# Patient Record
Sex: Female | Born: 1955 | Race: Black or African American | Hispanic: No | Smoking: Current every day smoker
Health system: Southern US, Community
[De-identification: ages and names within clinical notes are randomized; demographics above are authoritative.]

---

## 2008-06-12 ENCOUNTER — Emergency Department (HOSPITAL_BASED_OUTPATIENT_CLINIC_OR_DEPARTMENT_OTHER): Admission: EM | Admit: 2008-06-12 | Discharge: 2008-06-13 | Payer: Self-pay | Admitting: Emergency Medicine

## 2008-06-13 ENCOUNTER — Ambulatory Visit: Payer: Self-pay | Admitting: Radiology

## 2008-06-17 ENCOUNTER — Emergency Department (HOSPITAL_BASED_OUTPATIENT_CLINIC_OR_DEPARTMENT_OTHER): Admission: EM | Admit: 2008-06-17 | Discharge: 2008-06-17 | Payer: Self-pay | Admitting: Emergency Medicine

## 2008-08-09 ENCOUNTER — Inpatient Hospital Stay (HOSPITAL_COMMUNITY): Admission: RE | Admit: 2008-08-09 | Discharge: 2008-08-11 | Payer: Self-pay | Admitting: Internal Medicine

## 2008-08-09 ENCOUNTER — Encounter: Payer: Self-pay | Admitting: Emergency Medicine

## 2008-08-09 ENCOUNTER — Ambulatory Visit: Payer: Self-pay | Admitting: Diagnostic Radiology

## 2009-04-09 ENCOUNTER — Observation Stay (HOSPITAL_COMMUNITY): Admission: EM | Admit: 2009-04-09 | Discharge: 2009-04-09 | Payer: Self-pay | Admitting: Emergency Medicine

## 2009-07-09 IMAGING — CT CT ANGIO CHEST
2 of 6 series · 19 of 36 positions shown · IV contrast (APPLIED)
Comparison: Chest x-ray 08/09/2008

CLINICAL DATA: Left-sided chest pain which radiates to the
substernal region.  Dysphagia and shortness of breath.

CT ANGIOGRAPHY CHEST WITH CONTRAST
TECHNIQUE: Multidetector CT imaging of the chest was performed
using the standard protocol during bolus administration of
intravenous contrast. Multiplanar CT image reconstructions
including MIPs were obtained to evaluate the vascular anatomy.
Contrast: 80 ml Omnipaque 350

[Series 5: pe 1.0 b25f · axial · 0.61mm/px · z∈[+1098,+1367]mm · 18 of 299 slices shown]
[im 15/299  lung]
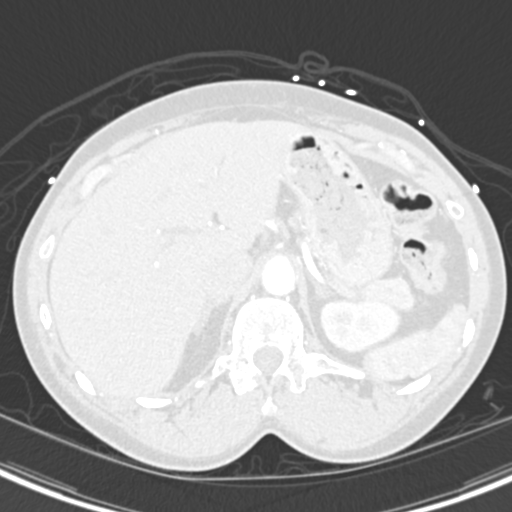
[im 30/299  mediastinal]
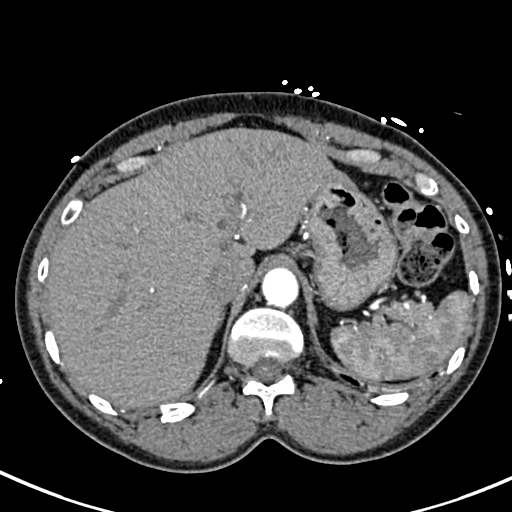
[im 45/299  lung]
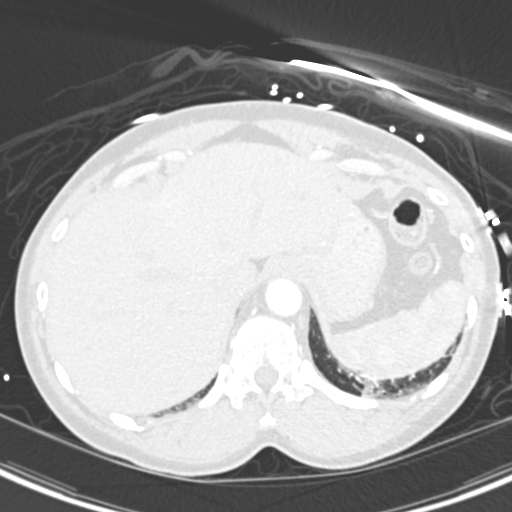
[im 60/299  mediastinal]
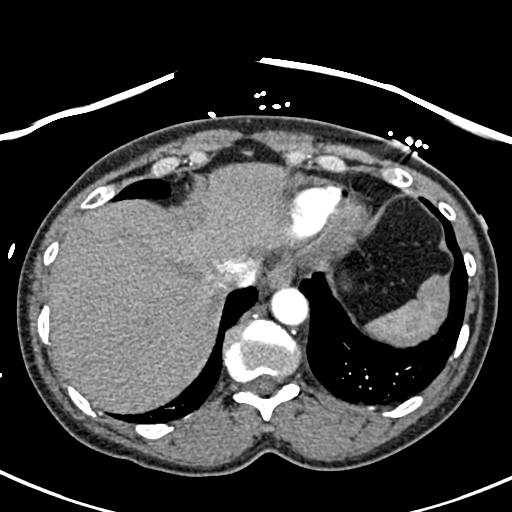
[im 75/299  lung]
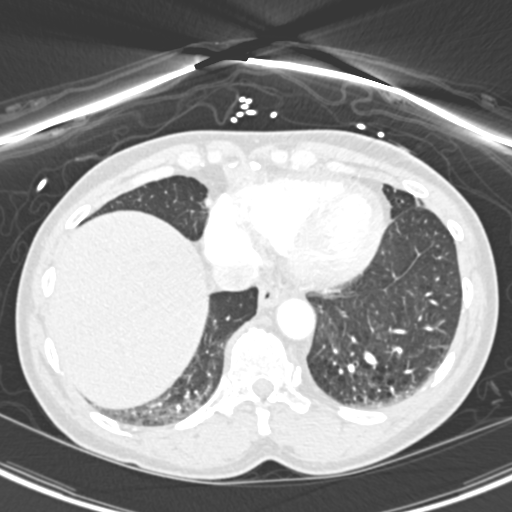
[im 90/299  mediastinal]
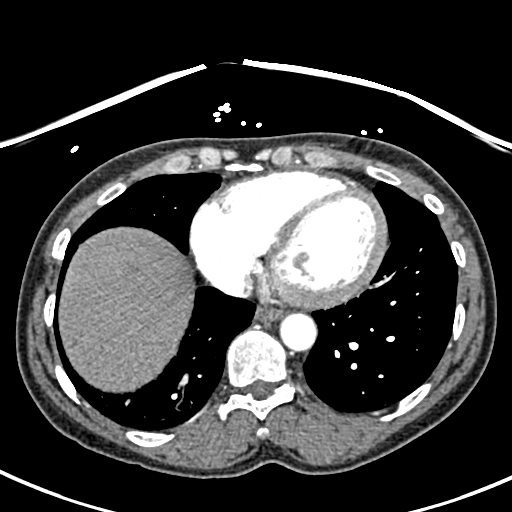
[im 105/299  lung]
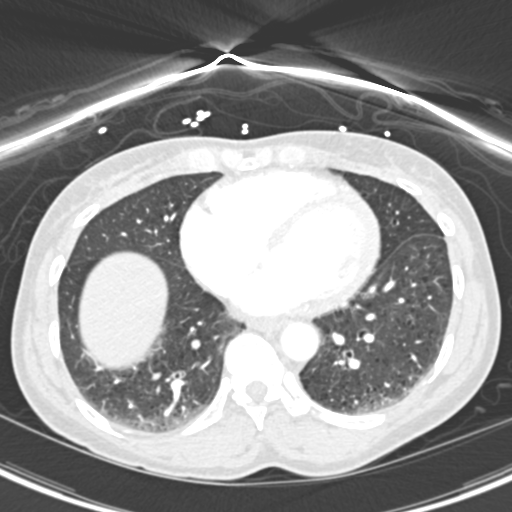
[im 120/299  mediastinal]
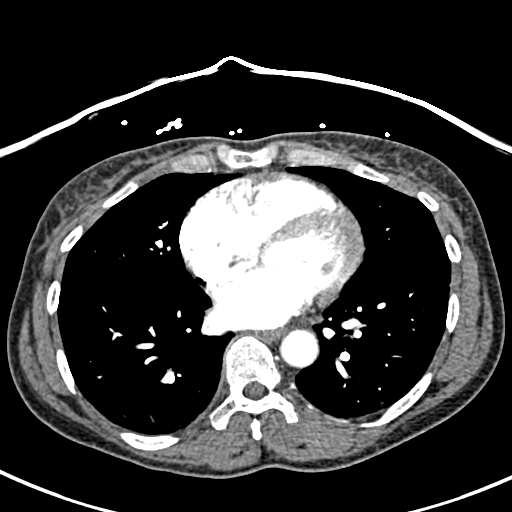
[im 135/299  lung]
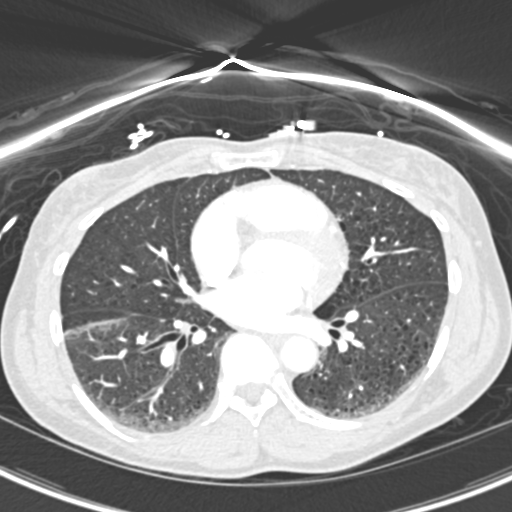
[im 164/299  mediastinal]
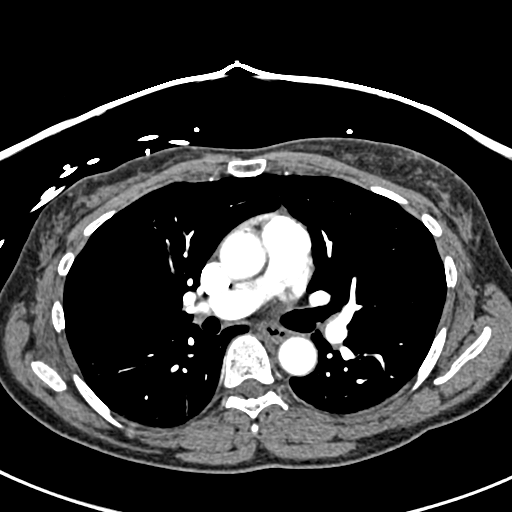
[im 179/299  lung]
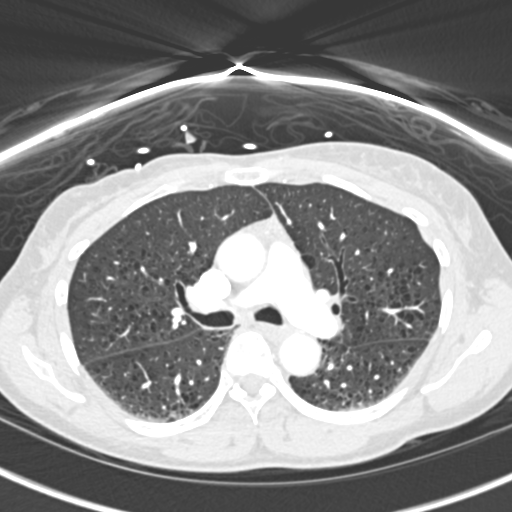
[im 194/299  mediastinal]
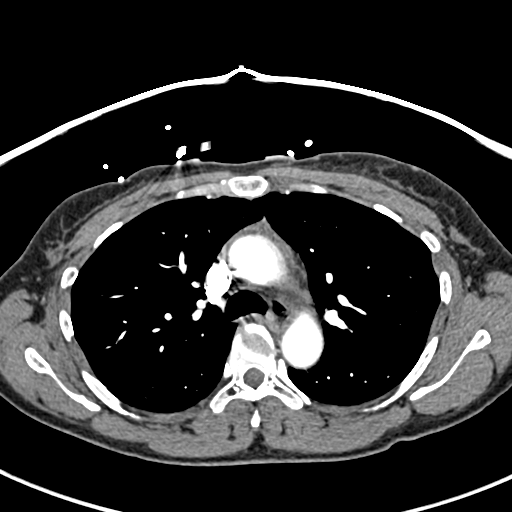
[im 209/299  lung]
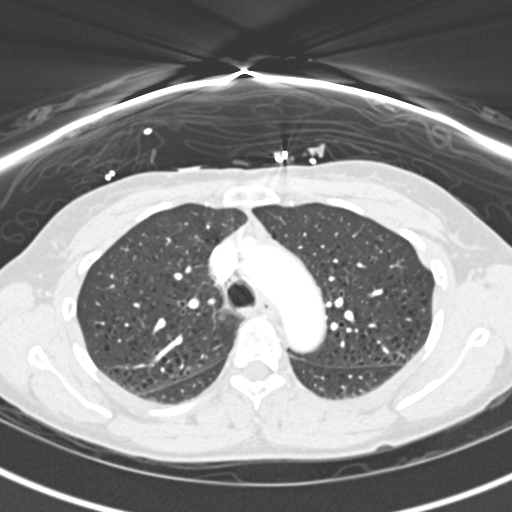
[im 224/299  mediastinal]
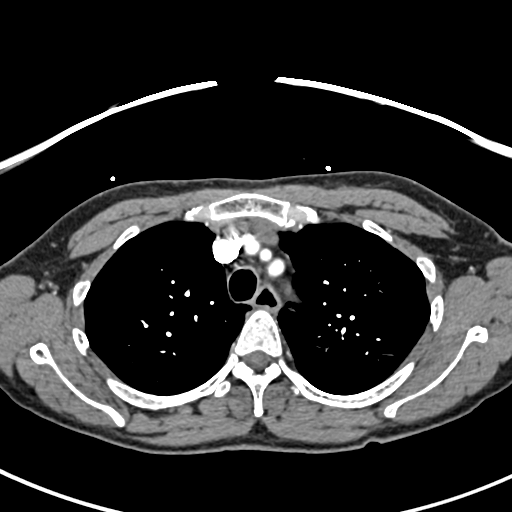
[im 239/299  lung]
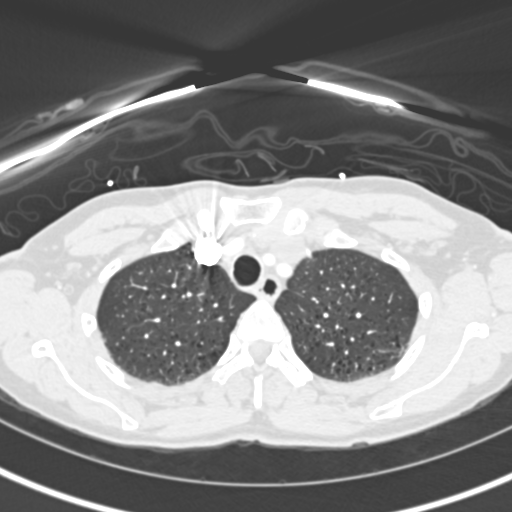
[im 254/299  mediastinal]
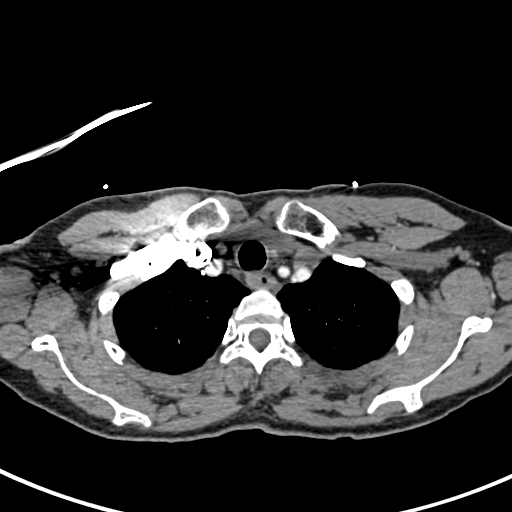
[im 269/299  lung]
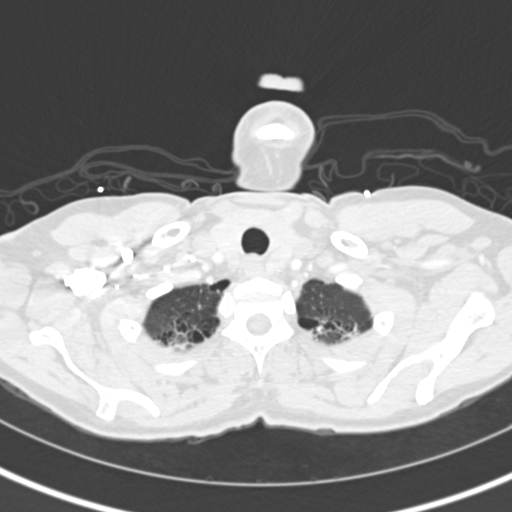
[im 284/299  mediastinal]
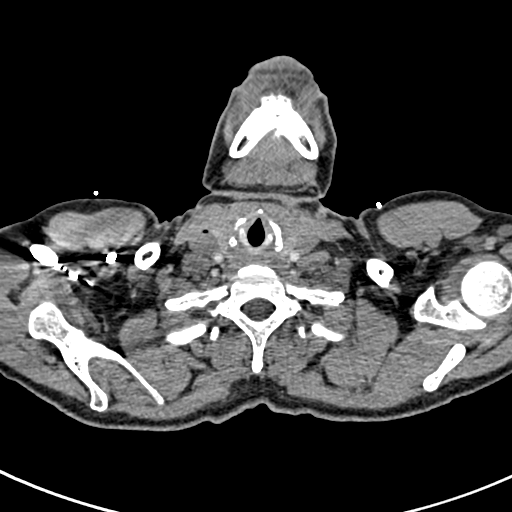

[Series 8: pe 2.0 coronal · coronal · 0.59mm/px · 1 of 107 slices shown]
[im 54/107  mediastinal]
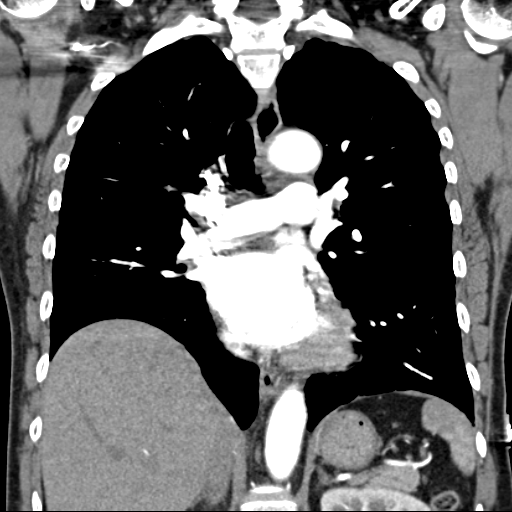

[19 of 36 positions shown; findings below may reference images not displayed]

FINDINGS: No pulmonary embolus.  There may be residual thymus in
the anterior mediastinum.  No pathologically enlarged mediastinal,
hilar or axillary lymph nodes.  Heart size is at the upper limits
of normal.  No pericardial effusion.

Biapical scarring.  Centrilobular emphysema.  Dependent atelectasis
and/or scarring bilaterally.  Lungs are otherwise clear.  No
pleural fluid.  Airway unremarkable.

Incidental imaging of the upper abdomen shows low attenuation
lesions in the liver, measuring up to 1.6 x 1.3 cm.  In the absence
of known malignancy, these are likely cysts and/or hemangiomas.
Old right rib fracture.  No worrisome lytic or sclerotic lesions.

Review of the MIP images confirms the above findings.
IMPRESSION: No pulmonary embolus.  No acute findings in the chest.

## 2010-05-24 LAB — POCT I-STAT, CHEM 8
Creatinine, Ser: 0.9 mg/dL (ref 0.4–1.2)
Hemoglobin: 14.3 g/dL (ref 12.0–15.0)
Potassium: 3.2 mEq/L — ABNORMAL LOW (ref 3.5–5.1)
TCO2: 26 mmol/L (ref 0–100)

## 2010-05-24 LAB — GLUCOSE, CAPILLARY: Glucose-Capillary: 116 mg/dL — ABNORMAL HIGH (ref 70–99)

## 2010-05-24 LAB — URINALYSIS, ROUTINE W REFLEX MICROSCOPIC
Glucose, UA: NEGATIVE mg/dL
Ketones, ur: NEGATIVE mg/dL
Nitrite: NEGATIVE
Protein, ur: NEGATIVE mg/dL
pH: 7.5 (ref 5.0–8.0)

## 2010-06-12 LAB — CBC
Hemoglobin: 13.1 g/dL (ref 12.0–15.0)
MCHC: 34.4 g/dL (ref 30.0–36.0)
MCV: 94.8 fL (ref 78.0–100.0)
Platelets: 208 10*3/uL (ref 150–400)
RBC: 4.02 MIL/uL (ref 3.87–5.11)
RDW: 12.6 % (ref 11.5–15.5)
WBC: 8.4 10*3/uL (ref 4.0–10.5)

## 2010-06-12 LAB — BASIC METABOLIC PANEL
BUN: 14 mg/dL (ref 6–23)
Creatinine, Ser: 0.7 mg/dL (ref 0.4–1.2)
GFR calc Af Amer: >60 mL/min (ref >60)
Potassium: 3.7 mEq/L (ref 3.5–5.1)
Sodium: 143 mEq/L (ref 135–145)

## 2010-06-12 LAB — CARDIAC PANEL(CRET KIN+CKTOT+MB+TROPI)
CK, MB: 1.8 ng/mL (ref 0.3–4.0)
CK, MB: 2 ng/mL (ref 0.3–4.0)
CK, MB: 2.5 ng/mL (ref 0.3–4.0)
Relative Index: 2.1 (ref 0.0–2.5)
Relative Index: INVALID (ref 0.0–2.5)
Relative Index: INVALID (ref 0.0–2.5)
Total CK: 121 U/L (ref 7–177)
Total CK: 81 U/L (ref 7–177)
Troponin I: 0.01 ng/mL (ref 0.00–0.06)

## 2010-06-12 LAB — RAPID URINE DRUG SCREEN, HOSP PERFORMED
Barbiturates: POSITIVE — AB
Benzodiazepines: NOT DETECTED
Cocaine: NOT DETECTED
Opiates: NOT DETECTED

## 2010-06-12 LAB — TSH: TSH: 1.638 u[IU]/mL (ref 0.350–4.500)

## 2010-06-12 LAB — LIPID PANEL: Triglycerides: 86 mg/dL (ref <150)

## 2010-06-14 LAB — URINALYSIS, ROUTINE W REFLEX MICROSCOPIC
Bilirubin Urine: NEGATIVE
Ketones, ur: NEGATIVE mg/dL
Nitrite: NEGATIVE
Specific Gravity, Urine: 1.005 (ref 1.005–1.030)
Urobilinogen, UA: 0.2 mg/dL (ref 0.0–1.0)

## 2010-06-14 LAB — GC/CHLAMYDIA PROBE AMP, GENITAL: GC Probe Amp, Genital: NEGATIVE

## 2010-06-14 LAB — PREGNANCY, URINE: Preg Test, Ur: NEGATIVE

## 2010-06-14 LAB — WET PREP, GENITAL

## 2010-06-14 LAB — URINE MICROSCOPIC-ADD ON

## 2010-07-18 NOTE — H&P (Signed)
Jessica Waller, Jessica Waller            ACCOUNT NO.:  [id number]   MEDICAL RECORD NO.:  [id number]          PATIENT TYPE:  INP   LOCATION:  1226                         FACILITY:  [facility]   PHYSICIAN:  Della Goo, M.D. DATE OF BIRTH:  11/26/1955   DATE OF ADMISSION:  08/09/2008  DATE OF DISCHARGE:                              HISTORY & PHYSICAL   PRIMARY CARE PHYSICIAN:  None.   CHIEF COMPLAINT:  Chest pain.   HISTORY OF PRESENT ILLNESS:  This is a 55 year old female who was sent  from the [facility] to the [facility] emergency  department for further evaluation secondary to complaints of chest pain  which she states started at about 2:00 p.m.  The patient describes  having constant squeezing sharp chest pain in the middle of her chest.  She reports having shortness of breath associated with the discomfort.  She denies having any nausea, vomiting or diaphoresis.   PAST MEDICAL HISTORY:  1. Arthritis.  2. Alcohol and drug dependence.  Of note, the patient has been in the      [facility] program for the past 70 days for detox from alcohol      and cocaine.  3,  History of tobacco abuse.   PAST SURGICAL HISTORY:  Patient reports having a gum surgery after a  failed root canal.   MEDICATIONS:  Celexa, prednisone taper, multivitamin, Prilosec,  Gabapentin, Mobic, Robaxin, ibuprofen and trazodone.   ALLERGIES:  NO KNOWN DRUG ALLERGIES.   SOCIAL HISTORY:  The patient reports smoking five to six cigarettes  daily, and she states he has smoked for many years.  She reports having  a past history of alcohol and cocaine abuse.  Her last drink and last  use of cocaine were 70 days ago.   FAMILY HISTORY:  Positive for coronary artery disease in her mother and  her brother, positive for hypertension in both parents, positive for  diabetes in her maternal grandmother.  No history of cancer in her  family that she knows of.   REVIEW OF SYSTEMS:  Pertinents are  mentioned above.   PHYSICAL EXAMINATION FINDINGS:  GENERAL:  This is a 55 year old thin  female in discomfort but no acute distress.  VITAL SIGNS:  Temperature 97.9, blood pressure 132/80, heart rate 79,  respirations 20, O2 sats 99%.  HEENT:  Normocephalic, atraumatic.  Pupils equally round, reactive to  light, extraocular movements are intact.  Funduscopic benign.  Oropharynx is clear.  NECK:  Supple full range of motion.  No thyromegaly, adenopathy, jugular  venous distention.  CARDIOVASCULAR:  Regular rate and rhythm.  No murmurs, gallops or rubs.  LUNGS:  Clear to auscultation bilaterally.  ABDOMEN:  Positive bowel sounds, soft, nontender, nondistended.  EXTREMITIES:  Without cyanosis, clubbing or edema.  NEUROLOGIC:  The patient is alert and oriented x3.  There are no focal  deficits on examination.   LABORATORY STUDIES:  White blood cell count 8.4, hemoglobin 13.1,  hematocrit 38.1, MCV 94.8, platelets 208, D-dimer is less than 0.22.  Sodium 143, potassium 3.7, chloride 106, carbon dioxide 26, BUN 14,  creatinine 0.7  and glucose 124.  EKG reveals a normal sinus rhythm  without acute ST-segment changes.  Point of care cardiac markers with a  myoglobin of 35.2, CK-MB 1.9, troponin less than 0.05.   ASSESSMENT:  A 55 year old female being admitted with:  1. Chest pain.  2. Tobacco abuse.  3. Past history of cocaine abuse.  4. Arthritis.   PLAN:  The patient will be admitted to the step-down ICU area.  She has  been placed on nitrates, oxygen and aspirin therapy.  The patient will  also be placed on DVT and GI prophylaxis.  Her regular medications have  been reviewed and continued.  Further workup will ensue pending results  of her studies and her clinical course.      Della Goo, M.D.  Electronically Signed     HJ/MEDQ  D:  08/10/2008  T:  08/10/2008  Job:  [id number]

## 2010-07-18 NOTE — Discharge Summary (Signed)
Jessica Waller, Jessica Waller            ACCOUNT NO.:  [id number]   MEDICAL RECORD NO.:  [id number]          PATIENT TYPE:  INP   LOCATION:  1504                         FACILITY:  [facility]   PHYSICIAN:  Beckey Rutter, MD  DATE OF BIRTH:  1955/05/06   DATE OF ADMISSION:  08/09/2008  DATE OF DISCHARGE:  08/11/2008                               DISCHARGE SUMMARY   PRIMARY CARE PHYSICIAN:  None.   CHIEF COMPLAINT:  Chest pain.   HISTORY OF PRESENT ILLNESS AND HOSPITAL COURSE:  A 55 year old very  pleasant African American female admitted from [facility] detox because  of chest pain.  The patient was ruled out for acute coronary syndrome by  serial cardiac enzymes and EKGs.  The patient had CT angiogram which was  also negative.  Her chest pain was atypical and unlikely noncardiac in  origin.  That being said, I advised the patient to follow up with the  primary physician for further stress test as needed.  She is aware and  agreeable to the followup plan.   DISCHARGE DIAGNOSES:  1. Chest pain ruled out for acute coronary syndrome.  2. Tobacco abuse.  3. Past history of cocaine abuse and ethanol abuse.  4. Arthritis.   DISCHARGE MEDICATIONS:  1. Aspirin 81 mg daily.  2. Celexa 60 mg p.o. daily.  3. Neurontin 300 mg p.o. daily.  4. Mobic 7.5 mg p.o. b.i.d.  5. Multivitamins 1 tablet daily.  6. Prednisone 20 mg p.o. daily.  7. Trazodone 100 mg p.o. q.h.s.  8. Senokot-S 1 tablet p.o. q.h.s. p.r.n.  9. Ibuprofen/Advil 600 mg p.o. q.8 h p.r.n.   DISCHARGE PLAN:  As discussed above the patient was advised to follow up  with the primary care physician for maintenance purposes as well as  further stratification.  Currently the patient does not seem to have a  cardiogenic origin for her chest pain.  She was also ruled out for acute  coronary syndrome as discussed above.  She was stable to be released  back to [place] to continue her detox.      Beckey Rutter, MD  Electronically  Signed    EME/MEDQ  D:  08/11/2008  T:  08/11/2008  Job:  [id number]

## 2020-05-06 ENCOUNTER — Encounter: Payer: Self-pay | Admitting: Family Medicine

## 2020-05-06 ENCOUNTER — Ambulatory Visit (INDEPENDENT_AMBULATORY_CARE_PROVIDER_SITE_OTHER): Payer: 59 | Admitting: Family Medicine

## 2020-05-06 ENCOUNTER — Other Ambulatory Visit: Payer: Self-pay

## 2020-05-06 VITALS — BP 110/64 | HR 82 | Ht 66.5 in | Wt 124.0 lb

## 2020-05-06 DIAGNOSIS — I493 Ventricular premature depolarization: Secondary | ICD-10-CM

## 2020-05-06 DIAGNOSIS — Z0000 Encounter for general adult medical examination without abnormal findings: Secondary | ICD-10-CM

## 2020-05-06 DIAGNOSIS — M1991 Primary osteoarthritis, unspecified site: Secondary | ICD-10-CM | POA: Diagnosis not present

## 2020-05-06 DIAGNOSIS — J439 Emphysema, unspecified: Secondary | ICD-10-CM | POA: Diagnosis not present

## 2020-05-06 DIAGNOSIS — E559 Vitamin D deficiency, unspecified: Secondary | ICD-10-CM

## 2020-05-06 DIAGNOSIS — G479 Sleep disorder, unspecified: Secondary | ICD-10-CM

## 2020-05-06 DIAGNOSIS — F17200 Nicotine dependence, unspecified, uncomplicated: Secondary | ICD-10-CM

## 2020-05-06 NOTE — Progress Notes (Signed)
    SUBJECTIVE:   CHIEF COMPLAINT / HPI:   New patient:  Patient here today to establish care.  She previously went to [facility] in [place].  Past medical issues include emphysema, degenerative disc disease, prediabetes, and vitamin d deficiency.  She states she is up to date on her pap, mammogram, and colonoscopy.    Pt states she takes trazadone 75mg  qhs for sleep.  She also uses albuterol as needed. She uses her albuterol once a week approximately.  She takes extra strength tylenol approximately 4 times a week for arthritis.  She takes a multivitamin once a day.  The patient states she wore a holter monitor for two weeks a few years ago due to presyncopal episodes.  She states they found frequent PVCs but nothing else.    Vit D deficiency - had a level of 16. Was prescribed pills once a week. 8 weeks.  Never rechecked vit d levels after taht.     PERTINENT  PMH / PSH:  0.5 ppd smoker .  No desire to quit.  Hasn't quit since pregnancy.  Pt thinks she has a ct chest for lung cancer screening in the past.   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 .   OBJECTIVE:   BP 110/64   Pulse 82   Ht 5' 6.5" (1.689 m)   Wt 124 lb (56.2 kg)   SpO2 93%   BMI 19.71 kg/m   Gen: alert, oriented. No acute distress.  Heent: perrla, eomi.  False upper teeth.  Cv: RRR. No murmurs Pulm: lctab Gi: soft, nontender.  Msk: 5/5 strength b/l.  Neuro: CN 2-12 grossly intact.    ASSESSMENT/PLAN:   Healthcare maintenance Appears to have had mammogram as recently as 12/2018 which was normal.  Unable to see colonoscopy results.  Had a CT chest in 2019 that showed emphysema but no masses.  Normal pap in 01/2020.    Smoker Pt has no desire to quit at this time.    Vitamin D deficiency Will get repeat now that she has completed her course.   Osteoarthritis Chronic stable.  Takes 4 tylenol a week.    Emphysema of lung (HCC) Uses albuterol approx once a week.    Difficulty  sleeping Takes trazadone 1.5 tabs of 50mg  qhs.  Does not need refill today.       02/2020, MD [facility]

## 2020-05-06 NOTE — Patient Instructions (Addendum)
It was nice to see you today,  I would like to see you back in 3 months or sooner if you need to.  I have sent in a new prescription for your trazodone that will be ready at the end of the month when you need it.  I have ordered a vitamin D lab to check for your vitamin D deficiency.  I will call you with results when I get them.  Have a nice day,  Frederic Jericho, MD

## 2020-05-07 LAB — CBC WITH DIFFERENTIAL
Basophils Absolute: 0 10*3/uL (ref 0.0–0.2)
Basos: 0 %
EOS (ABSOLUTE): 0 10*3/uL (ref 0.0–0.4)
Eos: 0 %
Hematocrit: 38.7 % (ref 34.0–46.6)
Hemoglobin: 13.1 g/dL (ref 11.1–15.9)
Immature Grans (Abs): 0 10*3/uL (ref 0.0–0.1)
Immature Granulocytes: 0 %
Lymphocytes Absolute: 2.6 10*3/uL (ref 0.7–3.1)
Lymphs: 51 %
MCH: 31.4 pg (ref 26.6–33.0)
MCHC: 33.9 g/dL (ref 31.5–35.7)
MCV: 93 fL (ref 79–97)
Monocytes Absolute: 0.4 10*3/uL (ref 0.1–0.9)
Monocytes: 8 %
Neutrophils Absolute: 2.1 10*3/uL (ref 1.4–7.0)
Neutrophils: 41 %
RBC: 4.17 x10E6/uL (ref 3.77–5.28)
RDW: 13.2 % (ref 11.7–15.4)
WBC: 5 10*3/uL (ref 3.4–10.8)

## 2020-05-07 LAB — VITAMIN D 25 HYDROXY (VIT D DEFICIENCY, FRACTURES): Vit D, 25-Hydroxy: 29.8 ng/mL — ABNORMAL LOW (ref 30.0–100.0)

## 2020-05-08 DIAGNOSIS — J439 Emphysema, unspecified: Secondary | ICD-10-CM | POA: Insufficient documentation

## 2020-05-08 DIAGNOSIS — I493 Ventricular premature depolarization: Secondary | ICD-10-CM | POA: Insufficient documentation

## 2020-05-08 DIAGNOSIS — E559 Vitamin D deficiency, unspecified: Secondary | ICD-10-CM | POA: Insufficient documentation

## 2020-05-08 DIAGNOSIS — Z0000 Encounter for general adult medical examination without abnormal findings: Secondary | ICD-10-CM | POA: Insufficient documentation

## 2020-05-08 DIAGNOSIS — M1990 Unspecified osteoarthritis, unspecified site: Secondary | ICD-10-CM | POA: Insufficient documentation

## 2020-05-08 DIAGNOSIS — F17200 Nicotine dependence, unspecified, uncomplicated: Secondary | ICD-10-CM | POA: Insufficient documentation

## 2020-05-08 DIAGNOSIS — G479 Sleep disorder, unspecified: Secondary | ICD-10-CM | POA: Insufficient documentation

## 2020-05-08 NOTE — Assessment & Plan Note (Signed)
Pt has no desire to quit at this time.

## 2020-05-08 NOTE — Assessment & Plan Note (Signed)
Will get repeat now that she has completed her course.

## 2020-05-08 NOTE — Assessment & Plan Note (Signed)
Chronic stable.  Takes 4 tylenol a week.

## 2020-05-08 NOTE — Assessment & Plan Note (Signed)
Appears to have had mammogram as recently as 12/2018 which was normal.  Unable to see colonoscopy results.  Had a CT chest in 2019 that showed emphysema but no masses.  Normal pap in 01/2020.

## 2020-05-08 NOTE — Assessment & Plan Note (Signed)
Uses albuterol approx once a week.

## 2020-05-08 NOTE — Assessment & Plan Note (Signed)
Takes trazadone 1.5 tabs of 50mg  qhs.  Does not need refill today.

## 2020-05-10 ENCOUNTER — Encounter: Payer: Self-pay | Admitting: Family Medicine

## 2020-06-20 ENCOUNTER — Ambulatory Visit (INDEPENDENT_AMBULATORY_CARE_PROVIDER_SITE_OTHER): Payer: 59 | Admitting: Family Medicine

## 2020-06-20 ENCOUNTER — Other Ambulatory Visit: Payer: Self-pay

## 2020-06-20 ENCOUNTER — Encounter: Payer: Self-pay | Admitting: Family Medicine

## 2020-06-20 DIAGNOSIS — E559 Vitamin D deficiency, unspecified: Secondary | ICD-10-CM | POA: Diagnosis not present

## 2020-06-20 DIAGNOSIS — Z0000 Encounter for general adult medical examination without abnormal findings: Secondary | ICD-10-CM | POA: Diagnosis not present

## 2020-06-20 MED ORDER — TRAZODONE HCL 50 MG PO TABS: 75.0000 mg | ORAL_TABLET | Freq: Every evening | ORAL | 1 refills | Status: DC | PRN

## 2020-06-20 NOTE — Patient Instructions (Addendum)
It was nice to see you today,  Your vitamin D was barely low at 29.8 (30.0 is normal).  You need to take any vitamin D supplementation, but if you choose to I would take 1000 IU of vitamin D3 daily.  I will refill your trazodone prescription for 90 days.  Please follow-up with him the next 6 months.  Have a great day,  Frederic Jericho, MD

## 2020-06-20 NOTE — Progress Notes (Signed)
    SUBJECTIVE:   CHIEF COMPLAINT / HPI:   Patient was here to discuss her mom CBC from last decreased to 29.8.  Discussed pros and cons of supplementation with patient.  She is not interested in vitamin D supplementation at this time.  Takes a multivitamin which likely has calcium in it.  Patient had a colonoscopy 20.  Results are in the Care Everywhere.  Marland Kitchen  COVID booster: Patient interested in getting the Covid booster.  Has had 1 during previously.  Thinks it has been at least 3 months since her last booster.  Does not have proof of vaccination on her right now.  Patient will go to [facility] for booster  PERTINENT  PMH / PSH:   OBJECTIVE:   BP 108/68   Pulse 77   Ht 5\' 7"  (1.702 m)   Wt 130 lb 3.2 oz (59.1 kg)   SpO2 94%   BMI 20.39 kg/m   General: Alert and oriented.  No acute distress. No respiratory distress.  No tachypnea.  ASSESSMENT/PLAN:   Vitamin D deficiency Slightly low at 29.8 from last visit.  Engaged in shared decision-making with the patient and she opted not to start daily supplementation.  She takes a multivitamin daily which likely has some vitamin D and calcium in it.  Healthcare maintenance Discussed getting a fourth dose of COVID vaccine.  Patient has had Moderna previously.  She would like to stick with the same brand so she will go to the [facility].  Advised her that she is eligible for fourth dose given her age and smoking status.  Colonoscopy results from 2020 are in care everywhere.  Will document healthcare maintenance tab.  Patient not interested HIV or hep C screening today.     , MD [facility]

## 2020-06-20 NOTE — Assessment & Plan Note (Signed)
Discussed getting a fourth dose of COVID vaccine.  Patient has had Moderna previously.  She would like to stick with the same brand so she will go to the [facility].  Advised her that she is eligible for fourth dose given her age and smoking status.  Colonoscopy results from 2020 are in care everywhere.  Will document healthcare maintenance tab.  Patient not interested HIV or hep C screening today.

## 2020-06-20 NOTE — Assessment & Plan Note (Signed)
Slightly low at 29.8 from last visit.  Engaged in shared decision-making with the patient and she opted not to start daily supplementation.  She takes a multivitamin daily which likely has some vitamin D and calcium in it.

## 2020-09-27 ENCOUNTER — Other Ambulatory Visit: Payer: Self-pay | Admitting: Family Medicine

## 2020-12-16 ENCOUNTER — Ambulatory Visit: Payer: 59 | Admitting: Family Medicine

## 2021-01-03 ENCOUNTER — Other Ambulatory Visit: Payer: Self-pay

## 2021-01-03 ENCOUNTER — Ambulatory Visit (INDEPENDENT_AMBULATORY_CARE_PROVIDER_SITE_OTHER): Payer: 59 | Admitting: Family Medicine

## 2021-01-03 ENCOUNTER — Encounter: Payer: Self-pay | Admitting: Family Medicine

## 2021-01-03 VITALS — BP 124/65 | HR 66 | Temp 98.7°F | Ht 67.0 in | Wt 127.2 lb

## 2021-01-03 DIAGNOSIS — F17200 Nicotine dependence, unspecified, uncomplicated: Secondary | ICD-10-CM

## 2021-01-03 DIAGNOSIS — G479 Sleep disorder, unspecified: Secondary | ICD-10-CM | POA: Diagnosis not present

## 2021-01-03 DIAGNOSIS — Z23 Encounter for immunization: Secondary | ICD-10-CM | POA: Diagnosis not present

## 2021-01-03 MED ORDER — TRAZODONE HCL 50 MG PO TABS: ORAL_TABLET | ORAL | 1 refills | Status: DC

## 2021-01-03 NOTE — Assessment & Plan Note (Signed)
Trazodone refilled.

## 2021-01-03 NOTE — Addendum Note (Signed)
Addended by: Littie Deeds D on: 01/03/2021 08:09 PM   Modules accepted: Kipp Brood

## 2021-01-03 NOTE — Progress Notes (Addendum)
    SUBJECTIVE:   CHIEF COMPLAINT / HPI:   Patient is here for her flu vaccine and to meet new PCP.  No concerns today.  She needs refill of her trazodone.  Discussed tobacco use.  She has no interest in quitting currently.  She lives with her two sons, 58 and 29. She works as a Estate agent at [employer].  PERTINENT  PMH / PSH: tobacco use, emphysema, OA, insomnia  OBJECTIVE:   BP 124/65   Pulse 66   Temp 98.7 F (37.1 C)   Ht 5\' 7"  (1.702 m)   Wt 127 lb 3.2 oz (57.7 kg)   SpO2 95%   BMI 19.92 kg/m   General: Alert, NAD HEENT: MMM, posterior oropharynx clear CV: RRR, no murmurs Pulm: CTAB, mildly decreased breath sounds, no wheezes or rales  ASSESSMENT/PLAN:   Difficulty sleeping Trazodone refilled.  Smoker Counseling provided, patient is precontemplative.   HCM - Covid vaccine booster recommended, plans to return for this - Tdap, reports received it 2 years ago - pneumonia vaccine due, plans to return for this - shingles vaccine declined, states too expensive - flu shot given today - mammogram due, patient will schedule  , MD [facility]

## 2021-01-03 NOTE — Assessment & Plan Note (Signed)
Counseling provided, patient is precontemplative.

## 2021-01-03 NOTE — Patient Instructions (Addendum)
It was nice seeing you today!  Come back and schedule a nurse visit for your vaccines. You are due for the pneumonia shot and I also recommend the new Covid booster.  Next physical in 1 year or return sooner if needed.  Please arrive at least 15 minutes prior to your scheduled appointments.  Stay well, Jessica Deeds, MD [facility] [phone]

## 2021-09-15 ENCOUNTER — Encounter: Payer: Self-pay | Admitting: Family Medicine

## 2021-09-15 ENCOUNTER — Telehealth: Payer: Self-pay | Admitting: Family Medicine

## 2021-09-15 ENCOUNTER — Ambulatory Visit (INDEPENDENT_AMBULATORY_CARE_PROVIDER_SITE_OTHER): Payer: 59 | Admitting: Family Medicine

## 2021-09-15 VITALS — BP 144/86 | HR 70 | Ht 67.0 in | Wt 128.6 lb

## 2021-09-15 DIAGNOSIS — Z23 Encounter for immunization: Secondary | ICD-10-CM | POA: Diagnosis not present

## 2021-09-15 DIAGNOSIS — Z1159 Encounter for screening for other viral diseases: Secondary | ICD-10-CM | POA: Diagnosis not present

## 2021-09-15 DIAGNOSIS — F17200 Nicotine dependence, unspecified, uncomplicated: Secondary | ICD-10-CM

## 2021-09-15 DIAGNOSIS — R7303 Prediabetes: Secondary | ICD-10-CM | POA: Diagnosis not present

## 2021-09-15 DIAGNOSIS — Z1231 Encounter for screening mammogram for malignant neoplasm of breast: Secondary | ICD-10-CM

## 2021-09-15 DIAGNOSIS — Z0000 Encounter for general adult medical examination without abnormal findings: Secondary | ICD-10-CM | POA: Diagnosis not present

## 2021-09-15 DIAGNOSIS — Z114 Encounter for screening for human immunodeficiency virus [HIV]: Secondary | ICD-10-CM | POA: Diagnosis not present

## 2021-09-15 NOTE — Telephone Encounter (Signed)
Patient came in stating that she the [facility] does not take Friday [facility] and wants to be referred to somewhere that accepts her insurance if possible.

## 2021-09-15 NOTE — Patient Instructions (Addendum)
It was nice seeing you today!  Let us know if you ever want help quitting smoking.  Stay well, Littie Deeds, MD [facility] [phone]  --  Make sure to check out at the front desk before you leave today.  Please arrive at least 15 minutes prior to your scheduled appointments.  If you had blood work today, I will send you a MyChart message or a letter if results are normal. Otherwise, I will give you a call.  If you had a referral placed, they will call you to set up an appointment. Please give [place] a call if you don't hear back in the next 2 weeks.  If you need additional refills before your next appointment, please call your pharmacy first.

## 2021-09-15 NOTE — Progress Notes (Cosign Needed Addendum)
    SUBJECTIVE:   CHIEF COMPLAINT / HPI:   Jessica Waller is a 66 y.o. female who presents today for follow-up. She is doing well overall today and has no major complaints.  She does report some trouble falling asleep, will wake up frequently if not taking Trazodone. She does report sleeping better with Trazodone. Hasn't tried anything else other than Trazodone to help with sleep. Sometimes snores while sleeping. No sleep apnea workup.  She denies SOB, ever, chills, body aches.  She reports infrequent chest discomfort; sometimes while laying down, often after eating. Described as a clenching sensation. Happening once or twice a month, will happen at night (not waking her from sleep); not lasting a long time.  She currently smokes 0.5 ppd; she has smoked since her 70s. Not willing to quit at this time. No recreational drug use. No alcohol use.  Diet: two meals a day, primarily home cooked, trying for a good balance. 1 cup of coffee a day in the mornings; 1 soda at lunch. Exercise: Walking 1-2 miles a day Work: [employer]. Lives: With her nephew, occasionally a son. Feels safe at home.  PERTINENT  PMH / PSH: PVCs, prediabetes, tobacco use  OBJECTIVE:   BP (!) 144/86   Pulse 70   Ht 5\' 7"  (1.702 m)   Wt 128 lb 9.6 oz (58.3 kg)   SpO2 100%   BMI 20.14 kg/m   General: Pt is well appearing and seated comfortably; no acute distress. Cardiovascular: No murmurs, rubs, gallops. Occasional premature beats. Pulmonary: Normal work of breathing. Lungs clear to auscultation bilaterally. Neuro/Psych: A&Ox3. Normal affect.  ASSESSMENT/PLAN:   Prediabetes Previous HA1c form 11/2019 was in the prediabetic range at 6.1. - Will continue to monitor with repeat HA1c today.  Smoker She continues to smoke 0.5 ppd. No interest in quitting at this time. - Pt informed that, should she ever become interested in quitting, she can always reach out to this office for assistance.   Healthcare  Maintenance - Order placed for Mammogram - Order placed for CT Chest given pt is a longstanding, current smoker - Order placed for blood work to look for Hep C, HIV; lipid panel ordered to monitor cholesterol - Tdap booster given in clinic today  12/2019, Medical Student [facility]   Approximately 20 pack-year smoking history.  Elevated BP - mildly elevated, no prior elevated readings. Re-check in 6 months.  I was personally present and performed or re-performed the history, physical exam and medical decision making activities of this service and have verified that the service and findings are accurately documented in the student's note.  Governor Rooks, MD                  09/15/2021, 5:38 PM

## 2021-09-15 NOTE — Progress Notes (Deleted)
    SUBJECTIVE:   CHIEF COMPLAINT / HPI:  Chief Complaint  Patient presents with   Annual Exam    ***  PERTINENT  PMH / PSH: ***  Patient Care Team: Littie Deeds, MD as PCP - General (Family Medicine)   OBJECTIVE:   BP (!) 144/86   Pulse 70   Ht 5\' 7"  (1.702 m)   Wt 128 lb 9.6 oz (58.3 kg)   SpO2 100%   BMI 20.14 kg/m   Physical Exam      09/15/2021    3:57 PM  Depression screen PHQ 2/9  Decreased Interest 1  Down, Depressed, Hopeless 0  PHQ - 2 Score 1  Altered sleeping 3  Tired, decreased energy 2  Change in appetite 1  Feeling bad or failure about yourself  0  Trouble concentrating 0  Moving slowly or fidgety/restless 0  Suicidal thoughts 0  PHQ-9 Score 7     {Show previous vital signs (optional):23777}  {Labs  Heme  Chem  Endocrine  Serology  Results Review (optional):23779}  ASSESSMENT/PLAN:   No problem-specific Assessment & Plan notes found for this encounter.   HCM - mammogram - Tdap [facility] ***  No follow-ups on file.   4/9, MD [facility]

## 2021-09-15 NOTE — Assessment & Plan Note (Signed)
She continues to smoke 0.5 ppd. No interest in quitting at this time. - Pt informed that, should she ever become interested in quitting, she can always reach out to this office for assistance.

## 2021-09-15 NOTE — Assessment & Plan Note (Signed)
Previous HA1c form 11/2019 was in the prediabetic range at 6.1. - Will continue to monitor with repeat HA1c today.

## 2021-09-16 LAB — LIPID PANEL
Chol/HDL Ratio: 2.8 ratio (ref 0.0–4.4)
Cholesterol, Total: 193 mg/dL (ref 100–199)
HDL: 69 mg/dL (ref >39)
LDL Chol Calc (NIH): 112 mg/dL — ABNORMAL HIGH (ref 0–99)
Triglycerides: 64 mg/dL (ref 0–149)
VLDL Cholesterol Cal: 12 mg/dL (ref 5–40)

## 2021-09-16 LAB — HCV AB W REFLEX TO QUANT PCR: HCV Ab: NONREACTIVE

## 2021-09-16 LAB — HEMOGLOBIN A1C
Est. average glucose Bld gHb Est-mCnc: 126 mg/dL
Hgb A1c MFr Bld: 6 % — ABNORMAL HIGH (ref 4.8–5.6)

## 2021-09-16 LAB — HIV ANTIBODY (ROUTINE TESTING W REFLEX): HIV Screen 4th Generation wRfx: NONREACTIVE

## 2021-09-16 LAB — HCV INTERPRETATION

## 2021-09-19 NOTE — Telephone Encounter (Signed)
Please advise patient she can have her mammogram done at [facility]. [phone] should be the number for her to call and schedule. I have placed an updated order.

## 2021-09-21 ENCOUNTER — Other Ambulatory Visit: Payer: Self-pay | Admitting: Family Medicine

## 2021-09-21 DIAGNOSIS — E785 Hyperlipidemia, unspecified: Secondary | ICD-10-CM

## 2021-09-21 MED ORDER — ATORVASTATIN CALCIUM 20 MG PO TABS: 20.0000 mg | ORAL_TABLET | Freq: Every day | ORAL | 0 refills | Status: DC

## 2021-09-29 ENCOUNTER — Ambulatory Visit (HOSPITAL_COMMUNITY)
Admission: RE | Admit: 2021-09-29 | Discharge: 2021-09-29 | Disposition: A | Discharge status: Home / Self Care | Payer: 59 | Source: Ambulatory Visit | Attending: Family Medicine | Admitting: Family Medicine

## 2021-09-29 ENCOUNTER — Ambulatory Visit (HOSPITAL_BASED_OUTPATIENT_CLINIC_OR_DEPARTMENT_OTHER)
Admission: RE | Admit: 2021-09-29 | Discharge: 2021-09-29 | Disposition: A | Discharge status: Home / Self Care | Payer: 59 | Source: Ambulatory Visit | Attending: Family Medicine | Admitting: Family Medicine

## 2021-09-29 ENCOUNTER — Encounter (HOSPITAL_COMMUNITY): Payer: Self-pay

## 2021-09-29 DIAGNOSIS — Z1231 Encounter for screening mammogram for malignant neoplasm of breast: Secondary | ICD-10-CM

## 2021-09-29 DIAGNOSIS — F17200 Nicotine dependence, unspecified, uncomplicated: Secondary | ICD-10-CM | POA: Diagnosis present

## 2021-09-29 DIAGNOSIS — Z0000 Encounter for general adult medical examination without abnormal findings: Secondary | ICD-10-CM

## 2021-10-01 ENCOUNTER — Encounter: Payer: Self-pay | Admitting: Family Medicine

## 2021-12-06 ENCOUNTER — Other Ambulatory Visit: Payer: Self-pay | Admitting: Family Medicine

## 2021-12-18 ENCOUNTER — Other Ambulatory Visit: Payer: Self-pay | Admitting: Family Medicine

## 2021-12-18 DIAGNOSIS — E785 Hyperlipidemia, unspecified: Secondary | ICD-10-CM

## 2022-06-03 ENCOUNTER — Other Ambulatory Visit: Payer: Self-pay | Admitting: Family Medicine

## 2022-06-12 ENCOUNTER — Other Ambulatory Visit: Payer: Self-pay | Admitting: Family Medicine
